# Patient Record
Sex: Male | Born: 1955 | Race: White | Hispanic: No | Marital: Married | Smoking: Never smoker
Health system: Southern US, Community
[De-identification: ages and names within clinical notes are randomized; demographics above are authoritative.]

## PROBLEM LIST (undated history)

## (undated) DIAGNOSIS — T7840XA Allergy, unspecified, initial encounter: Secondary | ICD-10-CM

## (undated) DIAGNOSIS — R42 Dizziness and giddiness: Secondary | ICD-10-CM

## (undated) DIAGNOSIS — I10 Essential (primary) hypertension: Secondary | ICD-10-CM

## (undated) DIAGNOSIS — J329 Chronic sinusitis, unspecified: Secondary | ICD-10-CM

## (undated) DIAGNOSIS — F419 Anxiety disorder, unspecified: Secondary | ICD-10-CM

## (undated) HISTORY — DX: Anxiety disorder, unspecified: F41.9

## (undated) HISTORY — DX: Allergy, unspecified, initial encounter: T78.40XA

## (undated) HISTORY — DX: Essential (primary) hypertension: I10

---

## 1985-11-25 HISTORY — PX: URETEROURETEROSTOMY: SHX497

## 2003-03-27 ENCOUNTER — Emergency Department (HOSPITAL_COMMUNITY): Admission: EM | Admit: 2003-03-27 | Discharge: 2003-03-28 | Payer: Self-pay | Admitting: Emergency Medicine

## 2006-03-24 ENCOUNTER — Emergency Department (HOSPITAL_COMMUNITY): Admission: EM | Admit: 2006-03-24 | Discharge: 2006-03-24 | Payer: Self-pay | Admitting: Emergency Medicine

## 2010-06-14 ENCOUNTER — Emergency Department (HOSPITAL_COMMUNITY): Admission: EM | Admit: 2010-06-14 | Discharge: 2010-06-14 | Payer: Self-pay | Admitting: Emergency Medicine

## 2010-07-15 ENCOUNTER — Emergency Department (HOSPITAL_COMMUNITY): Admission: EM | Admit: 2010-07-15 | Discharge: 2010-07-16 | Payer: Self-pay | Admitting: Emergency Medicine

## 2011-02-09 LAB — POCT I-STAT, CHEM 8
BUN: 11 mg/dL (ref 6–23)
Calcium, Ion: 1.15 mmol/L (ref 1.12–1.32)
Chloride: 104 mEq/L (ref 96–112)
Glucose, Bld: 136 mg/dL — ABNORMAL HIGH (ref 70–99)
HCT: 50 % (ref 39.0–52.0)
Potassium: 3.5 mEq/L (ref 3.5–5.1)
Sodium: 140 mEq/L (ref 135–145)

## 2012-06-14 ENCOUNTER — Emergency Department (HOSPITAL_COMMUNITY)
Admission: EM | Admit: 2012-06-14 | Discharge: 2012-06-14 | Disposition: A | Discharge status: Home / Self Care | Payer: BC Managed Care – PPO | Attending: Emergency Medicine | Admitting: Emergency Medicine

## 2012-06-14 ENCOUNTER — Encounter (HOSPITAL_COMMUNITY): Payer: Self-pay | Admitting: *Deleted

## 2012-06-14 DIAGNOSIS — R42 Dizziness and giddiness: Secondary | ICD-10-CM | POA: Insufficient documentation

## 2012-06-14 DIAGNOSIS — H8110 Benign paroxysmal vertigo, unspecified ear: Secondary | ICD-10-CM

## 2012-06-14 HISTORY — DX: Chronic sinusitis, unspecified: J32.9

## 2012-06-14 HISTORY — DX: Dizziness and giddiness: R42

## 2012-06-14 LAB — BASIC METABOLIC PANEL
Calcium: 8.3 mg/dL — ABNORMAL LOW (ref 8.4–10.5)
Creatinine, Ser: 0.98 mg/dL (ref 0.50–1.35)
GFR calc non Af Amer: >90 mL/min (ref >90)
Potassium: 3.3 mEq/L — ABNORMAL LOW (ref 3.5–5.1)

## 2012-06-14 LAB — CBC
HCT: 42.5 % (ref 39.0–52.0)
Hemoglobin: 14.7 g/dL (ref 13.0–17.0)
MCH: 30.8 pg (ref 26.0–34.0)
MCHC: 34.6 g/dL (ref 30.0–36.0)

## 2012-06-14 MED ORDER — ONDANSETRON HCL 4 MG PO TABS: 4.0000 mg | ORAL_TABLET | Freq: Four times a day (QID) | ORAL | Status: AC

## 2012-06-14 MED ORDER — POTASSIUM CHLORIDE CRYS ER 20 MEQ PO TBCR
40.0000 meq | EXTENDED_RELEASE_TABLET | Freq: Once | ORAL | Status: AC
  Administered 2012-06-14: 40 meq via ORAL
  Filled 2012-06-14: qty 2

## 2012-06-14 MED ORDER — MECLIZINE HCL 25 MG PO TABS
50.0000 mg | ORAL_TABLET | Freq: Once | ORAL | Status: AC
  Administered 2012-06-14: 50 mg via ORAL
  Filled 2012-06-14: qty 2

## 2012-06-14 MED ORDER — MECLIZINE HCL 50 MG PO TABS: 50.0000 mg | ORAL_TABLET | Freq: Three times a day (TID) | ORAL | Status: AC | PRN

## 2012-06-14 MED ORDER — AZITHROMYCIN 250 MG PO TABS: 250.0000 mg | ORAL_TABLET | Freq: Every day | ORAL | Status: AC

## 2012-06-14 NOTE — ED Notes (Signed)
Per EMS: patient awoke tonight with c/o severe dizziness and emesis. Patient sts he has been out in the heat and working out and not taking in fluids as he normally does. Patient nauseous on arrival. 4 zofran given in route, 18gauge Iv to left forearm, started by EMS. Patient has had sinus headache x 2 days.

## 2012-06-14 NOTE — ED Notes (Signed)
Pt denies having any pain, but states that he has extreme dizziness when he opens his eyes.

## 2012-06-14 NOTE — ED Notes (Signed)
ZOX:[id number]<AV> Expected date:06/14/12<BR> Expected time: 4:35 AM<BR> Means of arrival:Ambulance<BR> Comments:<BR> RM 12. Dizziness, vomiting, hx of vertigo

## 2012-06-14 NOTE — ED Notes (Signed)
Pt refused to perform orthostatic vital signs due to severe dizziness and nausea. Mardella Layman RN notified of patient's refusal. Pt states that he does not want to sit up or stand.

## 2012-06-14 NOTE — ED Provider Notes (Signed)
History     CSN: [id number]  Arrival date & time 06/14/12  0458   First MD Initiated Contact with Patient 06/14/12 0715      Chief Complaint  Patient presents with  . Dizziness    (Consider location/radiation/quality/duration/timing/severity/associated sxs/prior treatment) HPI Comments: 56 y/o otherwise healthy male comes in with cc of vertigo. Pt reports that he got woken up overnight due to vertiginous sensation. He described his vertigo as severe spinning sensation, that is worse with certain positions. There is associated nausea and emesis x 1. Patient reportst having similar symptoms about a year ago that was deemed to be due to sinusitis, and he was treated with antibiotics at that time. The current symptoms are worse. Patient does have some rhinorrhea, congestion and post nasal drip. He denies any ear pain ,but does have bilateral tinnitus, right worse than left. Pt has no fevers, chill, no headaches at this time.  The history is provided by the patient.    Past Medical History  Diagnosis Date  . Vertigo   . Sinus infection     History reviewed. No pertinent past surgical history.  History reviewed. No pertinent family history.  History  Substance Use Topics  . Smoking status: Not on file  . Smokeless tobacco: Not on file  . Alcohol Use:       Review of Systems  Constitutional: Positive for activity change. Negative for fever and chills.  HENT: Positive for congestion, rhinorrhea, postnasal drip and tinnitus. Negative for hearing loss, ear pain, neck pain and ear discharge.   Eyes: Negative for visual disturbance.  Respiratory: Negative for cough, chest tightness and shortness of breath.   Cardiovascular: Negative for chest pain.  Gastrointestinal: Negative for abdominal distention.  Genitourinary: Negative for dysuria, enuresis and difficulty urinating.  Musculoskeletal: Negative for arthralgias.  Neurological: Positive for dizziness. Negative for tremors,  seizures, syncope, speech difficulty, light-headedness, numbness and headaches.  Psychiatric/Behavioral: Negative for confusion.    Allergies  Codeine  Home Medications   Current Outpatient Rx  Name Route Sig Dispense Refill  . ALPRAZOLAM 0.5 MG PO TABS Oral Take 0.5 mg by mouth as needed. For anxiety    . DIPHENHYDRAMINE HCL (SLEEP) 25 MG PO TABS Oral Take 25 mg by mouth at bedtime as needed.    . TERBINAFINE HCL 250 MG PO TABS Oral Take 250 mg by mouth daily.      BP 102/64  Pulse 76  Temp 97.4 F (36.3 C) (Oral)  Resp 16  Ht 5\' 7"  (1.702 m)  Wt 160 lb (72.576 kg)  BMI 25.06 kg/m2  SpO2 97%  Physical Exam  Constitutional: He is oriented to person, place, and time. He appears well-developed.  HENT:  Head: Normocephalic and atraumatic.       nilateral cerumen in the canal - but no impaction, and TM is visualized.  Eyes: Conjunctivae and EOM are normal. Pupils are equal, round, and reactive to light.       There is some nystagmus appreciated with horizontal gaze, and a little bit of saccadic eye movement, particularly with left sided gaze.   Neck: Normal range of motion. Neck supple.  Cardiovascular: Normal rate, regular rhythm and normal heart sounds.   Pulmonary/Chest: Effort normal and breath sounds normal. No respiratory distress. He has no wheezes.  Abdominal: Soft. Bowel sounds are normal. He exhibits no distension. There is no tenderness. There is no rebound and no guarding.  Neurological: He is alert and oriented to person, place, and time.  No cranial nerve deficit. Coordination normal.       DIX HALLPIKE NOT DONE  - PATIENT'S SYMPTOMS WERE REPRODUCED WITH HIM JUST SITTING UP, AND IMPROVED WITH HIM LAYING THERE FOR FEW SECONDS.   Skin: Skin is warm.  Psychiatric: He has a normal mood and affect. His behavior is normal. Judgment and thought content normal.    ED Course  Procedures (including critical care time)  Labs Reviewed  BASIC METABOLIC PANEL - Abnormal;  Notable for the following:    Potassium 3.3 (*)     Glucose, Bld 146 (*)     Calcium 8.3 (*)     All other components within normal limits  CBC   No results found.   No diagnosis found.    MDM  Patient comes in with cc of vertigo. Based on history and findings, this appears to be peripheral vertigo. Although previously he had sinusitis as the cause, this appears more so to be BPPV - as his sinus exam wasn't too remarkable. Will give meclizine and reassess.       9:42 AM Pt is feeling a lot better. Able to open his eye and sit up. Still has some vertigo like sensation with movement, but tolerable. We will discharge at this time with meclizine. We will give antibiotics as wait and watch approach. Pt not on HCTZ or other precipitants.  9:47 AM Epley maneuver material provided.  Derwood Kaplan, MD 06/16/12 1846

## 2012-06-14 NOTE — ED Notes (Signed)
Pt refused orthostatic vital signs. 

## 2013-03-01 ENCOUNTER — Ambulatory Visit
Admission: RE | Admit: 2013-03-01 | Discharge: 2013-03-01 | Disposition: A | Discharge status: Home / Self Care | Payer: BC Managed Care – PPO | Source: Ambulatory Visit | Attending: Family Medicine | Admitting: Family Medicine

## 2013-03-01 ENCOUNTER — Other Ambulatory Visit: Payer: Self-pay | Admitting: Family Medicine

## 2013-03-01 DIAGNOSIS — R140 Abdominal distension (gaseous): Secondary | ICD-10-CM

## 2013-03-01 MED ORDER — IOHEXOL 300 MG/ML  SOLN
100.0000 mL | Freq: Once | INTRAMUSCULAR | Status: AC | PRN
  Administered 2013-03-01: 100 mL via INTRAVENOUS

## 2016-07-10 DIAGNOSIS — R7301 Impaired fasting glucose: Secondary | ICD-10-CM | POA: Diagnosis not present

## 2016-07-10 DIAGNOSIS — Z0000 Encounter for general adult medical examination without abnormal findings: Secondary | ICD-10-CM | POA: Diagnosis not present

## 2016-07-10 DIAGNOSIS — Z125 Encounter for screening for malignant neoplasm of prostate: Secondary | ICD-10-CM | POA: Diagnosis not present

## 2016-07-16 DIAGNOSIS — Z0000 Encounter for general adult medical examination without abnormal findings: Secondary | ICD-10-CM | POA: Diagnosis not present

## 2016-07-16 DIAGNOSIS — R030 Elevated blood-pressure reading, without diagnosis of hypertension: Secondary | ICD-10-CM | POA: Diagnosis not present

## 2016-07-16 DIAGNOSIS — Z1389 Encounter for screening for other disorder: Secondary | ICD-10-CM | POA: Diagnosis not present

## 2016-07-16 DIAGNOSIS — R7301 Impaired fasting glucose: Secondary | ICD-10-CM | POA: Diagnosis not present

## 2016-07-16 DIAGNOSIS — Z23 Encounter for immunization: Secondary | ICD-10-CM | POA: Diagnosis not present

## 2016-07-16 DIAGNOSIS — M40209 Unspecified kyphosis, site unspecified: Secondary | ICD-10-CM | POA: Diagnosis not present

## 2016-07-25 DIAGNOSIS — D2272 Melanocytic nevi of left lower limb, including hip: Secondary | ICD-10-CM | POA: Diagnosis not present

## 2016-07-25 DIAGNOSIS — D225 Melanocytic nevi of trunk: Secondary | ICD-10-CM | POA: Diagnosis not present

## 2016-07-25 DIAGNOSIS — D1801 Hemangioma of skin and subcutaneous tissue: Secondary | ICD-10-CM | POA: Diagnosis not present

## 2016-07-25 DIAGNOSIS — D224 Melanocytic nevi of scalp and neck: Secondary | ICD-10-CM | POA: Diagnosis not present

## 2016-11-14 DIAGNOSIS — H01004 Unspecified blepharitis left upper eyelid: Secondary | ICD-10-CM | POA: Diagnosis not present

## 2016-11-14 DIAGNOSIS — H5211 Myopia, right eye: Secondary | ICD-10-CM | POA: Diagnosis not present

## 2016-11-14 DIAGNOSIS — H01001 Unspecified blepharitis right upper eyelid: Secondary | ICD-10-CM | POA: Diagnosis not present

## 2017-06-24 DIAGNOSIS — Z6824 Body mass index (BMI) 24.0-24.9, adult: Secondary | ICD-10-CM | POA: Diagnosis not present

## 2017-06-24 DIAGNOSIS — J020 Streptococcal pharyngitis: Secondary | ICD-10-CM | POA: Diagnosis not present

## 2017-06-24 DIAGNOSIS — J028 Acute pharyngitis due to other specified organisms: Secondary | ICD-10-CM | POA: Diagnosis not present

## 2017-07-10 DIAGNOSIS — R030 Elevated blood-pressure reading, without diagnosis of hypertension: Secondary | ICD-10-CM | POA: Diagnosis not present

## 2017-07-10 DIAGNOSIS — Z0000 Encounter for general adult medical examination without abnormal findings: Secondary | ICD-10-CM | POA: Diagnosis not present

## 2017-07-10 DIAGNOSIS — Z125 Encounter for screening for malignant neoplasm of prostate: Secondary | ICD-10-CM | POA: Diagnosis not present

## 2017-07-10 DIAGNOSIS — R7301 Impaired fasting glucose: Secondary | ICD-10-CM | POA: Diagnosis not present

## 2017-07-17 DIAGNOSIS — B351 Tinea unguium: Secondary | ICD-10-CM | POA: Diagnosis not present

## 2017-07-17 DIAGNOSIS — Z0000 Encounter for general adult medical examination without abnormal findings: Secondary | ICD-10-CM | POA: Diagnosis not present

## 2017-07-17 DIAGNOSIS — Z1389 Encounter for screening for other disorder: Secondary | ICD-10-CM | POA: Diagnosis not present

## 2017-07-17 DIAGNOSIS — R7301 Impaired fasting glucose: Secondary | ICD-10-CM | POA: Diagnosis not present

## 2017-07-17 DIAGNOSIS — E784 Other hyperlipidemia: Secondary | ICD-10-CM | POA: Diagnosis not present

## 2017-07-17 DIAGNOSIS — R030 Elevated blood-pressure reading, without diagnosis of hypertension: Secondary | ICD-10-CM | POA: Diagnosis not present

## 2017-07-18 DIAGNOSIS — Z1212 Encounter for screening for malignant neoplasm of rectum: Secondary | ICD-10-CM | POA: Diagnosis not present

## 2017-07-31 DIAGNOSIS — D225 Melanocytic nevi of trunk: Secondary | ICD-10-CM | POA: Diagnosis not present

## 2017-07-31 DIAGNOSIS — D2271 Melanocytic nevi of right lower limb, including hip: Secondary | ICD-10-CM | POA: Diagnosis not present

## 2017-07-31 DIAGNOSIS — I788 Other diseases of capillaries: Secondary | ICD-10-CM | POA: Diagnosis not present

## 2017-07-31 DIAGNOSIS — D2261 Melanocytic nevi of right upper limb, including shoulder: Secondary | ICD-10-CM | POA: Diagnosis not present

## 2017-07-31 DIAGNOSIS — D485 Neoplasm of uncertain behavior of skin: Secondary | ICD-10-CM | POA: Diagnosis not present

## 2017-07-31 DIAGNOSIS — L720 Epidermal cyst: Secondary | ICD-10-CM | POA: Diagnosis not present

## 2017-08-09 DIAGNOSIS — Z23 Encounter for immunization: Secondary | ICD-10-CM | POA: Diagnosis not present

## 2017-11-14 DIAGNOSIS — H01001 Unspecified blepharitis right upper eyelid: Secondary | ICD-10-CM | POA: Diagnosis not present

## 2017-11-14 DIAGNOSIS — H5211 Myopia, right eye: Secondary | ICD-10-CM | POA: Diagnosis not present

## 2017-11-14 DIAGNOSIS — H5213 Myopia, bilateral: Secondary | ICD-10-CM | POA: Diagnosis not present

## 2018-01-22 ENCOUNTER — Other Ambulatory Visit: Payer: Self-pay | Admitting: Internal Medicine

## 2018-01-22 DIAGNOSIS — E785 Hyperlipidemia, unspecified: Secondary | ICD-10-CM

## 2018-02-02 ENCOUNTER — Ambulatory Visit
Admission: RE | Admit: 2018-02-02 | Discharge: 2018-02-02 | Disposition: A | Discharge status: Home / Self Care | Payer: No Typology Code available for payment source | Source: Ambulatory Visit | Attending: Internal Medicine | Admitting: Internal Medicine

## 2018-02-02 DIAGNOSIS — E785 Hyperlipidemia, unspecified: Secondary | ICD-10-CM

## 2018-03-31 DIAGNOSIS — R030 Elevated blood-pressure reading, without diagnosis of hypertension: Secondary | ICD-10-CM | POA: Diagnosis not present

## 2018-03-31 DIAGNOSIS — K649 Unspecified hemorrhoids: Secondary | ICD-10-CM | POA: Diagnosis not present

## 2018-03-31 DIAGNOSIS — B359 Dermatophytosis, unspecified: Secondary | ICD-10-CM | POA: Diagnosis not present

## 2018-04-09 DIAGNOSIS — K644 Residual hemorrhoidal skin tags: Secondary | ICD-10-CM | POA: Diagnosis not present

## 2018-06-04 DIAGNOSIS — R030 Elevated blood-pressure reading, without diagnosis of hypertension: Secondary | ICD-10-CM | POA: Diagnosis not present

## 2018-06-04 DIAGNOSIS — Z6824 Body mass index (BMI) 24.0-24.9, adult: Secondary | ICD-10-CM | POA: Diagnosis not present

## 2018-06-04 DIAGNOSIS — B359 Dermatophytosis, unspecified: Secondary | ICD-10-CM | POA: Diagnosis not present

## 2018-06-04 DIAGNOSIS — K649 Unspecified hemorrhoids: Secondary | ICD-10-CM | POA: Diagnosis not present

## 2018-07-17 DIAGNOSIS — R300 Dysuria: Secondary | ICD-10-CM | POA: Diagnosis not present

## 2018-07-17 DIAGNOSIS — Z125 Encounter for screening for malignant neoplasm of prostate: Secondary | ICD-10-CM | POA: Diagnosis not present

## 2018-07-17 DIAGNOSIS — R7301 Impaired fasting glucose: Secondary | ICD-10-CM | POA: Diagnosis not present

## 2018-07-17 DIAGNOSIS — Z0000 Encounter for general adult medical examination without abnormal findings: Secondary | ICD-10-CM | POA: Diagnosis not present

## 2018-07-24 DIAGNOSIS — K649 Unspecified hemorrhoids: Secondary | ICD-10-CM | POA: Diagnosis not present

## 2018-07-24 DIAGNOSIS — L408 Other psoriasis: Secondary | ICD-10-CM | POA: Diagnosis not present

## 2018-07-24 DIAGNOSIS — Z23 Encounter for immunization: Secondary | ICD-10-CM | POA: Diagnosis not present

## 2018-07-24 DIAGNOSIS — Z0000 Encounter for general adult medical examination without abnormal findings: Secondary | ICD-10-CM | POA: Diagnosis not present

## 2018-07-24 DIAGNOSIS — Z1389 Encounter for screening for other disorder: Secondary | ICD-10-CM | POA: Diagnosis not present

## 2018-07-24 DIAGNOSIS — R7301 Impaired fasting glucose: Secondary | ICD-10-CM | POA: Diagnosis not present

## 2018-08-04 DIAGNOSIS — Z1212 Encounter for screening for malignant neoplasm of rectum: Secondary | ICD-10-CM | POA: Diagnosis not present

## 2018-08-06 DIAGNOSIS — D2271 Melanocytic nevi of right lower limb, including hip: Secondary | ICD-10-CM | POA: Diagnosis not present

## 2018-08-06 DIAGNOSIS — L918 Other hypertrophic disorders of the skin: Secondary | ICD-10-CM | POA: Diagnosis not present

## 2018-08-06 DIAGNOSIS — D1801 Hemangioma of skin and subcutaneous tissue: Secondary | ICD-10-CM | POA: Diagnosis not present

## 2018-08-06 DIAGNOSIS — L858 Other specified epidermal thickening: Secondary | ICD-10-CM | POA: Diagnosis not present

## 2018-08-06 DIAGNOSIS — D2261 Melanocytic nevi of right upper limb, including shoulder: Secondary | ICD-10-CM | POA: Diagnosis not present

## 2018-09-22 DIAGNOSIS — J069 Acute upper respiratory infection, unspecified: Secondary | ICD-10-CM | POA: Diagnosis not present

## 2018-09-22 DIAGNOSIS — J029 Acute pharyngitis, unspecified: Secondary | ICD-10-CM | POA: Diagnosis not present

## 2018-09-22 DIAGNOSIS — Z6824 Body mass index (BMI) 24.0-24.9, adult: Secondary | ICD-10-CM | POA: Diagnosis not present

## 2018-09-22 DIAGNOSIS — J3089 Other allergic rhinitis: Secondary | ICD-10-CM | POA: Diagnosis not present

## 2019-04-02 ENCOUNTER — Encounter: Payer: Self-pay | Admitting: Podiatry

## 2019-04-02 ENCOUNTER — Other Ambulatory Visit: Payer: Self-pay

## 2019-04-02 ENCOUNTER — Ambulatory Visit: Payer: BLUE CROSS/BLUE SHIELD | Admitting: Podiatry

## 2019-04-02 VITALS — Temp 97.3°F

## 2019-04-02 DIAGNOSIS — Q828 Other specified congenital malformations of skin: Secondary | ICD-10-CM

## 2019-04-02 DIAGNOSIS — M79671 Pain in right foot: Secondary | ICD-10-CM

## 2019-04-02 DIAGNOSIS — B351 Tinea unguium: Secondary | ICD-10-CM | POA: Diagnosis not present

## 2019-04-02 NOTE — Patient Instructions (Signed)
Keep the bandage on for 24 hours. At that time, remove and clean with soap and water. If it hurts or burns before 24 hours go ahead and remove the bandage and wash with soap and water. Keep the area clean. If there is any blistering cover with antibiotic ointment and a bandage. Monitor for any redness, drainage, or other signs of infection. Call the office if any are to occur. If you have any questions, please call the office at [phone].  

## 2019-04-02 NOTE — Progress Notes (Signed)
Subjective:   Patient ID: Jose Pratt, male   DOB: 63 y.o.   MRN: [id number]   HPI 63 year old male presents the office today for concerns of a painful skin lesion to the bottom of his right third toe area.  He states he was having some plantar fasciitis issues and he purchased inserts he thinks that they were sliding and since he wore the inserts this causes the lesion to form.  Not sure if there is a blister or what is going on.  No recent treatment.  He states that since switching shoes he has been improved.  Denies any injury or trauma denies of any foreign objects.  No swelling or redness.  He also states is a secondary issue he has been having some toenail fungus issues to both of his big toes he has been on 2 rounds of Lamisil without any significant improvement.  No pain of nails no redness or drainage or any signs of infection.  No other issues today.    Review of Systems  All other systems reviewed and are negative.  Past Medical History:  Diagnosis Date  . Sinus infection   . Vertigo     History reviewed. No pertinent surgical history.  No current outpatient medications on file.  Allergies  Allergen Reactions  . Codeine          Objective:  Physical Exam  General: AAO x3, NAD  Dermatological: Hyperkeratotic lesion just distal to the metatarsal head on the right third.  Upon debridement there is a punctate annular hyperkeratotic lesion that there is no underlying evidence of verruca, foreign body or any signs of an ulcer.  There is no drainage or pus.  Bilateral hallux nails are hypertrophic, dystrophic with yellow-brown discoloration.  There is no pain of the nails there is no surrounding redness or drainage clinical signs of infection noted today.  Vascular: Dorsalis Pedis artery and Posterior Tibial artery pedal pulses are 2/4 bilateral with immedate capillary fill time.There is no pain with calf compression, swelling, warmth, erythema.   Neruologic: Grossly  intact via light touch bilateral.  Protective threshold with Semmes Wienstein monofilament intact to all pedal sites bilateral.   Musculoskeletal: No gross boney pedal deformities bilateral. No pain, crepitus, or limitation noted with foot and ankle range of motion bilateral. Muscular strength 5/5 in all groups tested bilateral.  Gait: Unassisted, Nonantalgic.      Assessment:   63 year old male porokeratosis right foot, onychomycosis     Plan:  -Treatment options discussed including all alternatives, risks, and complications -Etiology of symptoms were discussed -Lesion was sharply debrided without any complications or bleeding.  Area was cleaned with alcohol and a pad was placed followed by salicylic acid and a bandage.  Post procedure instructions were discussed.  Monitoring signs or symptoms of infection. -Debrided bilateral hallux toenail specimens for culture, pathology to [facility] labs.   Return in about 6 weeks (around 05/14/2019).  Vivi Barrack DPM

## 2019-04-12 ENCOUNTER — Other Ambulatory Visit: Payer: Self-pay | Admitting: Podiatry

## 2019-04-12 ENCOUNTER — Encounter: Payer: Self-pay | Admitting: Podiatry

## 2019-04-12 ENCOUNTER — Ambulatory Visit: Payer: BLUE CROSS/BLUE SHIELD | Admitting: Podiatry

## 2019-04-12 ENCOUNTER — Ambulatory Visit (INDEPENDENT_AMBULATORY_CARE_PROVIDER_SITE_OTHER): Payer: BLUE CROSS/BLUE SHIELD

## 2019-04-12 ENCOUNTER — Other Ambulatory Visit: Payer: Self-pay

## 2019-04-12 VITALS — Temp 97.9°F

## 2019-04-12 DIAGNOSIS — S90851A Superficial foreign body, right foot, initial encounter: Secondary | ICD-10-CM

## 2019-04-12 DIAGNOSIS — M779 Enthesopathy, unspecified: Secondary | ICD-10-CM

## 2019-04-12 DIAGNOSIS — Q828 Other specified congenital malformations of skin: Secondary | ICD-10-CM

## 2019-04-15 NOTE — Progress Notes (Signed)
Subjective: 63 year old male presents the office today for follow-up evaluation of a painful skin lesion on the bottom of his right foot just proximal to the third digit.  He wants to come in to be seen because the pain is started to reoccur.  After he left the appointment last visit he states he felt great for some time but the pain did recur and because of this he want to come in for an earlier appointment.  He had no complications after the medication applied last visit.  He denies any redness or drainage or any swelling. Denies any systemic complaints such as fevers, chills, nausea, vomiting. No acute changes since last appointment, and no other complaints at this time.   Objective: AAO x3, NAD DP/PT pulses palpable bilaterally, CRT less than 3 seconds Hyperkeratotic lesion present on the right foot just proximal to the third digit.  Upon debridement this area has some macerated tissue as well as single punctate annular lesion in the middle.  There is no ongoing ulceration drainage or any signs of infection.  There was a small area of pinpoint bleeding that appeared to be more verruca today. No open lesions or pre-ulcerative lesions.  No pain with calf compression, swelling, warmth, erythema  Assessment: Right foot skin lesion, rule out foreign body  Plan: -All treatment options discussed with the patient including all alternatives, risks, complications.  -X-rays were obtained reviewed to rule out foreign body today given the appearance.  No evidence of foreign body identified. -Sharp debrided the hyperkeratotic tissue without any complications or bleeding.  There is a small amount of pinpoint bleeding which consistent with verruca.  However given that she was applied medicine last week and the area appeared to be well I did not apply any further acids today.  Offloading pads were dispensed.  I want this area to heal and we will see him back at the next appointment in the we will discuss further  treatment options. -Bako results have not come back yet.   Vivi Barrack DPM  -Patient encouraged to call the office with any questions, concerns, change in symptoms.

## 2019-04-26 ENCOUNTER — Other Ambulatory Visit: Payer: Self-pay

## 2019-04-26 ENCOUNTER — Ambulatory Visit: Payer: BLUE CROSS/BLUE SHIELD | Admitting: Podiatry

## 2019-04-26 ENCOUNTER — Encounter: Payer: Self-pay | Admitting: Podiatry

## 2019-04-26 VITALS — Temp 98.1°F

## 2019-04-26 DIAGNOSIS — B078 Other viral warts: Secondary | ICD-10-CM | POA: Diagnosis not present

## 2019-04-26 DIAGNOSIS — B079 Viral wart, unspecified: Secondary | ICD-10-CM

## 2019-04-26 NOTE — Progress Notes (Signed)
Subjective: 63 year old male presents the office today for evaluation of a spot on the ball of the right foot.  He states that it be getting somewhat better but still present.  Denies any increase in swelling redness or any drainage or pus. Denies any systemic complaints such as fevers, chills, nausea, vomiting. No acute changes since last appointment, and no other complaints at this time.   Objective: AAO x3, NAD DP/PT pulses palpable bilaterally, CRT less than 3 seconds Hyperkeratotic lesion still present on the right foot just proximal to the third digit plantarly.  Upon debridement there is hyperkeratotic lesion and after debridement it did appear to be more verruca today.  There is no surrounding erythema, ascending cellulitis.  No fluctuation crepitation any malodor. No open lesions or pre-ulcerative lesions.  No pain with calf compression, swelling, warmth, erythema  Assessment: Skin lesion right foot, appears to be more of a verruca today  Plan: -All treatment options discussed with the patient including all alternatives, risks, complications.  -Patient debrided the lesion without any complications or bleeding.  The area still with alcohol and a pad was placed followed by salicylic acid and a bandage.  He tolerated well.  Monitoring signs or symptoms of infection. -Patient encouraged to call the office with any questions, concerns, change in symptoms.   Vivi Barrack DPM

## 2019-05-05 ENCOUNTER — Telehealth: Payer: Self-pay | Admitting: *Deleted

## 2019-05-05 NOTE — Telephone Encounter (Signed)
-----   Message from Trula Slade, DPM sent at 05/03/2019  7:58 AM EDT ----- At some point this week can you call to see how he is doing? Thanks.

## 2019-05-05 NOTE — Telephone Encounter (Signed)
Called and spoke with the patient and the area of the right foot is doing so much better and can tell a difference and the doctor did a great job and I am almost back to normal and I would say 90% better and I am going to keep my appointment and I stated to call the office if any concerns or questions at [phone]. Lattie Haw

## 2019-05-14 ENCOUNTER — Ambulatory Visit: Payer: BLUE CROSS/BLUE SHIELD | Admitting: Podiatry

## 2019-05-17 ENCOUNTER — Ambulatory Visit (INDEPENDENT_AMBULATORY_CARE_PROVIDER_SITE_OTHER): Payer: BC Managed Care – PPO | Admitting: Podiatry

## 2019-05-17 ENCOUNTER — Other Ambulatory Visit: Payer: Self-pay

## 2019-05-17 ENCOUNTER — Encounter: Payer: Self-pay | Admitting: Podiatry

## 2019-05-17 VITALS — Temp 98.4°F

## 2019-05-17 DIAGNOSIS — B079 Viral wart, unspecified: Secondary | ICD-10-CM

## 2019-05-17 DIAGNOSIS — B351 Tinea unguium: Secondary | ICD-10-CM | POA: Diagnosis not present

## 2019-05-17 DIAGNOSIS — B078 Other viral warts: Secondary | ICD-10-CM | POA: Diagnosis not present

## 2019-05-18 ENCOUNTER — Telehealth: Payer: Self-pay | Admitting: *Deleted

## 2019-05-18 MED ORDER — JUBLIA 10 % EX SOLN: CUTANEOUS | 11 refills | Status: DC

## 2019-05-18 MED ORDER — JUBLIA 10 % EX SOLN: 1.0000 | Freq: Every day | CUTANEOUS | 11 refills | Status: DC

## 2019-05-18 NOTE — Telephone Encounter (Signed)
-----   Message from Trula Slade, DPM sent at 05/18/2019  1:15 PM EDT ----- I was trying to order jublia for him but when I look for the "[facility]" I cannot find it. Can you help me...please!!!

## 2019-05-18 NOTE — Telephone Encounter (Signed)
rx for jublia sent to [facility].

## 2019-05-19 NOTE — Progress Notes (Signed)
Subjective: 63 year old male presents the office today for evaluation of a skin lesion on the ball of the right foot.  He states he was doing well and is having no issues however the lesion started to come back causing pain. Denies any systemic complaints such as fevers, chills, nausea, vomiting. No acute changes since last appointment, and no other complaints at this time.   Objective: AAO x3, NAD DP/PT pulses palpable bilaterally, CRT less than 3 seconds Hyperkeratotic lesion still present on the right foot just proximal to the third digit plantarly.  Upon debridement there was evidence of verruca present.  There is no surrounding erythema, edema, drainage or pus or any clinical signs of infection.   Hallux nails are mildly discolored yellow discoloration and mildly hypertrophic.  No pain. No open lesions or pre-ulcerative lesions.  No pain with calf compression, swelling, warmth, erythema  Assessment: Skin lesion right foot, verruca; onychomycosis  Plan: -All treatment options discussed with the patient including all alternatives, risks, complications.  -Patient debrided the lesion without any complications or bleeding.  The area still with alcohol and a pad was placed followed by salicylic acid and a bandage. He tolerated well.  Monitoring signs or symptoms of infection. -Order compound cream today in regards to the verruca. -Reviewed nail culture with him today.  Prescribe Jublia for nail fungus -Patient encouraged to call the office with any questions, concerns, change in symptoms.   Trula Slade DPM

## 2019-05-24 ENCOUNTER — Telehealth: Payer: Self-pay | Admitting: *Deleted

## 2019-05-24 NOTE — Telephone Encounter (Signed)
-----   Message from Trula Slade, DPM sent at 05/19/2019 11:58 AM EDT ----- I don't thin I ordered the wart cream from [facility]. Can you send it over or remind me to do it Thursday? Thanks.

## 2019-05-24 NOTE — Telephone Encounter (Signed)
I sent the wart cream over to the [facility] on June 24 th 2020. Jose Pratt

## 2019-06-10 ENCOUNTER — Ambulatory Visit: Payer: BC Managed Care – PPO | Admitting: Podiatry

## 2019-06-10 ENCOUNTER — Other Ambulatory Visit: Payer: Self-pay

## 2019-06-10 DIAGNOSIS — B351 Tinea unguium: Secondary | ICD-10-CM

## 2019-06-10 DIAGNOSIS — B078 Other viral warts: Secondary | ICD-10-CM

## 2019-06-10 DIAGNOSIS — B079 Viral wart, unspecified: Secondary | ICD-10-CM

## 2019-06-10 NOTE — Progress Notes (Signed)
Subjective: 63 year old male presents the office today for evaluation of a skin lesion, verruca on the ball of the right foot.  Overall he states his been doing better.  No issues after last treatment of the salicylic acid.  He has been using a compound cream for the wart as well which is been helpful.  He states his been able to walk and more active without any pain.  No redness or drainage.  He also did get the Jublia.  No acute changes since last appointment, and no other complaints at this time.   Objective: AAO x3, NAD DP/PT pulses palpable bilaterally, CRT less than 3 seconds Hyperkeratotic lesion still present on the right foot just proximal to the third digit plantarly.  Overall the area is much smaller.  Upon debridement is more superficial.  No surrounding edema, erythema, drainage or pus there is no clinical signs of infection noted today.  No open lesions or pre-ulcerative lesions.  No pain with calf compression, swelling, warmth, erythema  Assessment: Skin lesion right foot, verruca; onychomycosis  Plan: -All treatment options discussed with the patient including all alternatives, risks, complications.  -Patient debrided the lesion without any complications or bleeding.  The area still with alcohol and a pad was placed followed by salicylic acid and a bandage. He tolerated well.  Monitoring signs or symptoms of infection. -Continue compound cream for verruca.  Only to restart this new about 3 to 4 days. -Continue Jublia for nail fungus -Patient encouraged to call the office with any questions, concerns, change in symptoms.   Trula Slade DPM

## 2019-07-12 ENCOUNTER — Ambulatory Visit: Payer: BC Managed Care – PPO | Admitting: Podiatry

## 2019-08-03 DIAGNOSIS — Z125 Encounter for screening for malignant neoplasm of prostate: Secondary | ICD-10-CM | POA: Diagnosis not present

## 2019-08-03 DIAGNOSIS — Z0000 Encounter for general adult medical examination without abnormal findings: Secondary | ICD-10-CM | POA: Diagnosis not present

## 2019-08-03 DIAGNOSIS — R7301 Impaired fasting glucose: Secondary | ICD-10-CM | POA: Diagnosis not present

## 2019-08-03 DIAGNOSIS — E7849 Other hyperlipidemia: Secondary | ICD-10-CM | POA: Diagnosis not present

## 2019-08-04 DIAGNOSIS — R82998 Other abnormal findings in urine: Secondary | ICD-10-CM | POA: Diagnosis not present

## 2019-08-09 DIAGNOSIS — E785 Hyperlipidemia, unspecified: Secondary | ICD-10-CM | POA: Diagnosis not present

## 2019-08-09 DIAGNOSIS — Z0000 Encounter for general adult medical examination without abnormal findings: Secondary | ICD-10-CM | POA: Diagnosis not present

## 2019-08-09 DIAGNOSIS — R030 Elevated blood-pressure reading, without diagnosis of hypertension: Secondary | ICD-10-CM | POA: Diagnosis not present

## 2019-08-09 DIAGNOSIS — R7301 Impaired fasting glucose: Secondary | ICD-10-CM | POA: Diagnosis not present

## 2019-08-09 DIAGNOSIS — Z1331 Encounter for screening for depression: Secondary | ICD-10-CM | POA: Diagnosis not present

## 2019-08-09 DIAGNOSIS — B359 Dermatophytosis, unspecified: Secondary | ICD-10-CM | POA: Diagnosis not present

## 2019-08-09 DIAGNOSIS — Z23 Encounter for immunization: Secondary | ICD-10-CM | POA: Diagnosis not present

## 2019-08-11 DIAGNOSIS — D2262 Melanocytic nevi of left upper limb, including shoulder: Secondary | ICD-10-CM | POA: Diagnosis not present

## 2019-08-11 DIAGNOSIS — D2261 Melanocytic nevi of right upper limb, including shoulder: Secondary | ICD-10-CM | POA: Diagnosis not present

## 2019-08-11 DIAGNOSIS — L858 Other specified epidermal thickening: Secondary | ICD-10-CM | POA: Diagnosis not present

## 2019-08-11 DIAGNOSIS — L570 Actinic keratosis: Secondary | ICD-10-CM | POA: Diagnosis not present

## 2019-08-11 DIAGNOSIS — D225 Melanocytic nevi of trunk: Secondary | ICD-10-CM | POA: Diagnosis not present

## 2019-08-18 DIAGNOSIS — R030 Elevated blood-pressure reading, without diagnosis of hypertension: Secondary | ICD-10-CM | POA: Diagnosis not present

## 2019-08-23 DIAGNOSIS — R030 Elevated blood-pressure reading, without diagnosis of hypertension: Secondary | ICD-10-CM | POA: Diagnosis not present

## 2019-12-03 ENCOUNTER — Ambulatory Visit: Payer: BC Managed Care – PPO | Attending: Internal Medicine

## 2019-12-03 DIAGNOSIS — Z20822 Contact with and (suspected) exposure to covid-19: Secondary | ICD-10-CM | POA: Diagnosis not present

## 2019-12-05 LAB — NOVEL CORONAVIRUS, NAA: SARS-CoV-2, NAA: NOT DETECTED

## 2019-12-16 DIAGNOSIS — H2513 Age-related nuclear cataract, bilateral: Secondary | ICD-10-CM | POA: Diagnosis not present

## 2019-12-16 DIAGNOSIS — H5213 Myopia, bilateral: Secondary | ICD-10-CM | POA: Diagnosis not present

## 2020-01-25 ENCOUNTER — Ambulatory Visit: Payer: No Typology Code available for payment source | Attending: Internal Medicine

## 2020-01-25 DIAGNOSIS — Z23 Encounter for immunization: Secondary | ICD-10-CM

## 2020-01-25 NOTE — Progress Notes (Signed)
   Covid-19 Vaccination Clinic  Name:  Jose Pratt    MRN: [id number] DOB: September 07, 1956  01/25/2020  [name] was observed post Covid-19 immunization for 15 minutes without incident. He was provided with Vaccine Information Sheet and instruction to access the V-Safe system.   [name] was instructed to call 911 with any severe reactions post vaccine: Marland Kitchen Difficulty breathing  . Swelling of face and throat  . A fast heartbeat  . A bad rash all over body  . Dizziness and weakness   Immunizations Administered    Name Date Dose VIS Date Route   Moderna COVID-19 Vaccine 01/25/2020 11:00 AM 0.5 mL 10/26/2019 Intramuscular   Manufacturer: Moderna   Lot: [id number]   NDC: 43539-122-58

## 2020-02-22 ENCOUNTER — Ambulatory Visit: Payer: No Typology Code available for payment source | Attending: Internal Medicine

## 2020-02-22 DIAGNOSIS — Z23 Encounter for immunization: Secondary | ICD-10-CM

## 2020-02-22 NOTE — Progress Notes (Signed)
   Covid-19 Vaccination Clinic  Name:  BOWIE DELIA    MRN: [id number] DOB: Dec 04, 1955  02/22/2020  Mr. Walberg was observed post Covid-19 immunization for 15 minutes without incident. He was provided with Vaccine Information Sheet and instruction to access the V-Safe system.   Mr. Scheaffer was instructed to call 911 with any severe reactions post vaccine: Marland Kitchen Difficulty breathing  . Swelling of face and throat  . A fast heartbeat  . A bad rash all over body  . Dizziness and weakness   Immunizations Administered    Name Date Dose VIS Date Route   Moderna COVID-19 Vaccine 02/22/2020  9:33 AM 0.5 mL 10/26/2019 Intramuscular   Manufacturer: Moderna   Lot: [id number]   NDC: 99412-904-75

## 2020-05-04 DIAGNOSIS — Z1331 Encounter for screening for depression: Secondary | ICD-10-CM | POA: Diagnosis not present

## 2020-08-24 DIAGNOSIS — R7301 Impaired fasting glucose: Secondary | ICD-10-CM | POA: Diagnosis not present

## 2020-08-24 DIAGNOSIS — E785 Hyperlipidemia, unspecified: Secondary | ICD-10-CM | POA: Diagnosis not present

## 2020-08-24 DIAGNOSIS — Z125 Encounter for screening for malignant neoplasm of prostate: Secondary | ICD-10-CM | POA: Diagnosis not present

## 2020-08-24 DIAGNOSIS — Z0000 Encounter for general adult medical examination without abnormal findings: Secondary | ICD-10-CM | POA: Diagnosis not present

## 2020-08-26 DIAGNOSIS — Z23 Encounter for immunization: Secondary | ICD-10-CM | POA: Diagnosis not present

## 2020-08-29 DIAGNOSIS — L812 Freckles: Secondary | ICD-10-CM | POA: Diagnosis not present

## 2020-08-29 DIAGNOSIS — D1801 Hemangioma of skin and subcutaneous tissue: Secondary | ICD-10-CM | POA: Diagnosis not present

## 2020-08-29 DIAGNOSIS — D224 Melanocytic nevi of scalp and neck: Secondary | ICD-10-CM | POA: Diagnosis not present

## 2020-08-29 DIAGNOSIS — D225 Melanocytic nevi of trunk: Secondary | ICD-10-CM | POA: Diagnosis not present

## 2020-08-31 DIAGNOSIS — E785 Hyperlipidemia, unspecified: Secondary | ICD-10-CM | POA: Diagnosis not present

## 2020-08-31 DIAGNOSIS — Z0000 Encounter for general adult medical examination without abnormal findings: Secondary | ICD-10-CM | POA: Diagnosis not present

## 2020-08-31 DIAGNOSIS — R82998 Other abnormal findings in urine: Secondary | ICD-10-CM | POA: Diagnosis not present

## 2020-12-04 DIAGNOSIS — Z1152 Encounter for screening for COVID-19: Secondary | ICD-10-CM | POA: Diagnosis not present

## 2020-12-18 DIAGNOSIS — H5213 Myopia, bilateral: Secondary | ICD-10-CM | POA: Diagnosis not present

## 2020-12-18 DIAGNOSIS — H524 Presbyopia: Secondary | ICD-10-CM | POA: Diagnosis not present

## 2020-12-18 DIAGNOSIS — H2513 Age-related nuclear cataract, bilateral: Secondary | ICD-10-CM | POA: Diagnosis not present

## 2021-08-29 DIAGNOSIS — L821 Other seborrheic keratosis: Secondary | ICD-10-CM | POA: Diagnosis not present

## 2021-08-29 DIAGNOSIS — D225 Melanocytic nevi of trunk: Secondary | ICD-10-CM | POA: Diagnosis not present

## 2021-08-29 DIAGNOSIS — D2261 Melanocytic nevi of right upper limb, including shoulder: Secondary | ICD-10-CM | POA: Diagnosis not present

## 2021-08-29 DIAGNOSIS — D1801 Hemangioma of skin and subcutaneous tissue: Secondary | ICD-10-CM | POA: Diagnosis not present

## 2021-08-31 DIAGNOSIS — R110 Nausea: Secondary | ICD-10-CM | POA: Diagnosis not present

## 2021-08-31 DIAGNOSIS — I10 Essential (primary) hypertension: Secondary | ICD-10-CM | POA: Diagnosis not present

## 2021-09-07 DIAGNOSIS — E785 Hyperlipidemia, unspecified: Secondary | ICD-10-CM | POA: Diagnosis not present

## 2021-09-07 DIAGNOSIS — R7301 Impaired fasting glucose: Secondary | ICD-10-CM | POA: Diagnosis not present

## 2021-09-11 DIAGNOSIS — R82998 Other abnormal findings in urine: Secondary | ICD-10-CM | POA: Diagnosis not present

## 2021-09-11 DIAGNOSIS — R7301 Impaired fasting glucose: Secondary | ICD-10-CM | POA: Diagnosis not present

## 2021-09-11 DIAGNOSIS — I10 Essential (primary) hypertension: Secondary | ICD-10-CM | POA: Diagnosis not present

## 2021-09-11 DIAGNOSIS — Z23 Encounter for immunization: Secondary | ICD-10-CM | POA: Diagnosis not present

## 2021-09-11 DIAGNOSIS — Z0000 Encounter for general adult medical examination without abnormal findings: Secondary | ICD-10-CM | POA: Diagnosis not present

## 2021-09-11 DIAGNOSIS — Z1212 Encounter for screening for malignant neoplasm of rectum: Secondary | ICD-10-CM | POA: Diagnosis not present

## 2021-09-24 ENCOUNTER — Other Ambulatory Visit: Payer: Self-pay | Admitting: Podiatry

## 2021-09-25 ENCOUNTER — Telehealth: Payer: Self-pay | Admitting: *Deleted

## 2021-09-25 NOTE — Telephone Encounter (Signed)
Please advise, f/u appointment may be needed

## 2021-09-25 NOTE — Telephone Encounter (Signed)
Appointment for a medication refill , has not been seen since 06/10/2019. Please advise.

## 2021-10-01 ENCOUNTER — Other Ambulatory Visit: Payer: Self-pay | Admitting: Podiatry

## 2021-10-01 MED ORDER — JUBLIA 10 % EX SOLN: CUTANEOUS | 11 refills | Status: DC

## 2021-10-09 NOTE — Telephone Encounter (Signed)
Lvm for pt to call back. 

## 2021-10-09 NOTE — Telephone Encounter (Signed)
Pt called back. He stated that it is expensive to come into the office and he will call back to schedule an appt when he is able to come in.

## 2021-10-10 DIAGNOSIS — Z23 Encounter for immunization: Secondary | ICD-10-CM | POA: Diagnosis not present

## 2021-10-22 ENCOUNTER — Other Ambulatory Visit: Payer: Self-pay | Admitting: Internal Medicine

## 2021-10-22 DIAGNOSIS — E7800 Pure hypercholesterolemia, unspecified: Secondary | ICD-10-CM

## 2021-10-25 ENCOUNTER — Other Ambulatory Visit: Payer: Self-pay | Admitting: Internal Medicine

## 2021-10-25 DIAGNOSIS — I6529 Occlusion and stenosis of unspecified carotid artery: Secondary | ICD-10-CM

## 2021-11-14 ENCOUNTER — Ambulatory Visit
Admission: RE | Admit: 2021-11-14 | Discharge: 2021-11-14 | Disposition: A | Discharge status: Home / Self Care | Payer: BC Managed Care – PPO | Source: Ambulatory Visit | Attending: Internal Medicine | Admitting: Internal Medicine

## 2021-11-14 ENCOUNTER — Ambulatory Visit
Admission: RE | Admit: 2021-11-14 | Discharge: 2021-11-14 | Disposition: A | Discharge status: Home / Self Care | Payer: No Typology Code available for payment source | Source: Ambulatory Visit | Attending: Internal Medicine | Admitting: Internal Medicine

## 2021-11-14 DIAGNOSIS — E785 Hyperlipidemia, unspecified: Secondary | ICD-10-CM | POA: Diagnosis not present

## 2021-11-14 DIAGNOSIS — I6523 Occlusion and stenosis of bilateral carotid arteries: Secondary | ICD-10-CM | POA: Diagnosis not present

## 2021-11-14 DIAGNOSIS — I6529 Occlusion and stenosis of unspecified carotid artery: Secondary | ICD-10-CM

## 2021-11-14 DIAGNOSIS — H539 Unspecified visual disturbance: Secondary | ICD-10-CM | POA: Diagnosis not present

## 2021-11-14 DIAGNOSIS — I10 Essential (primary) hypertension: Secondary | ICD-10-CM | POA: Diagnosis not present

## 2021-11-14 DIAGNOSIS — E7800 Pure hypercholesterolemia, unspecified: Secondary | ICD-10-CM

## 2021-12-19 DIAGNOSIS — H2513 Age-related nuclear cataract, bilateral: Secondary | ICD-10-CM | POA: Diagnosis not present

## 2021-12-19 DIAGNOSIS — H5211 Myopia, right eye: Secondary | ICD-10-CM | POA: Diagnosis not present

## 2021-12-19 DIAGNOSIS — G43909 Migraine, unspecified, not intractable, without status migrainosus: Secondary | ICD-10-CM | POA: Diagnosis not present

## 2022-05-02 DIAGNOSIS — U071 COVID-19: Secondary | ICD-10-CM | POA: Diagnosis not present

## 2022-06-24 IMAGING — US US CAROTID DUPLEX BILAT
1 series · 13 of 24 positions shown · non-contrast
Comparison: None.

CLINICAL DATA: History hypertension, hyperlipidemia and visual
disturbance.

EXAM:
BILATERAL CAROTID DUPLEX ULTRASOUND
TECHNIQUE: Gray scale imaging, color Doppler and duplex ultrasound were
performed of bilateral carotid and vertebral arteries in the neck.

[Series 1: us carotid duplex bilat · 0.06mm/px · 13 of 68 slices shown]
[im 1/68]
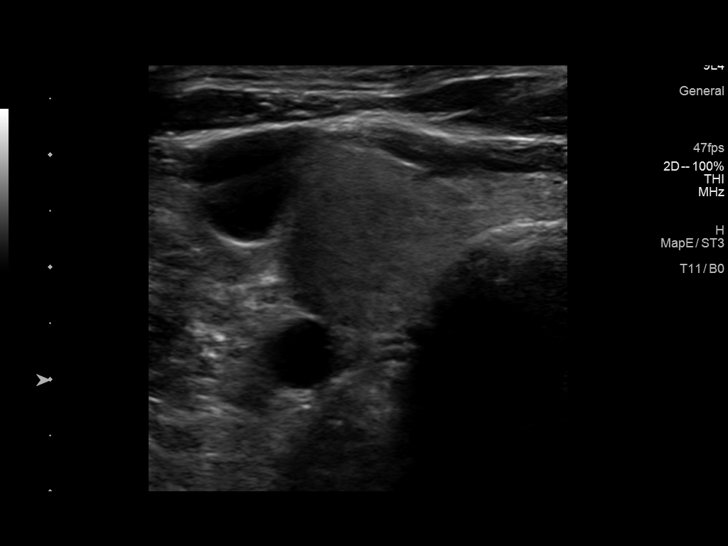
[im 6/68]
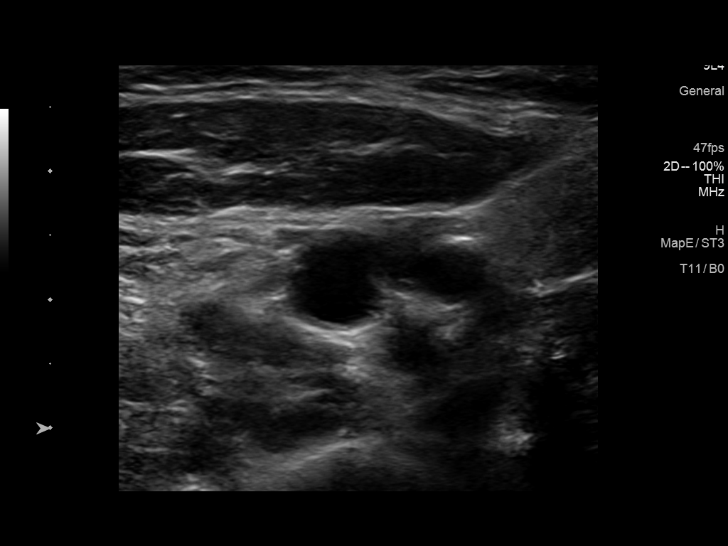
[im 12/68]
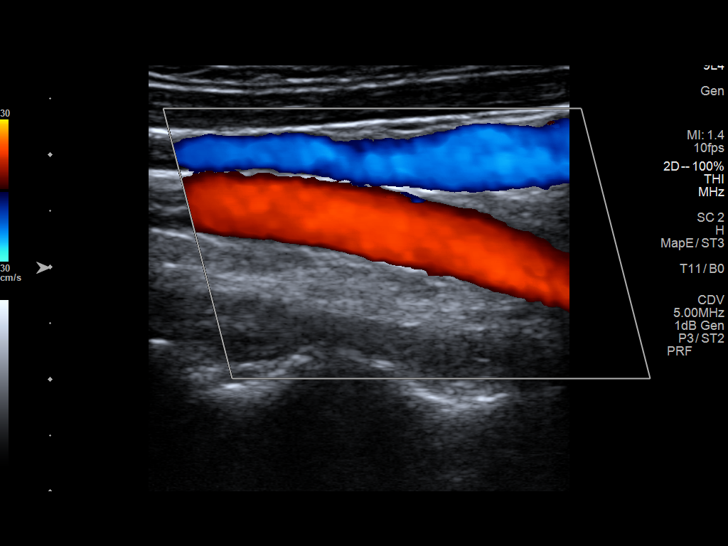
[im 18/68]
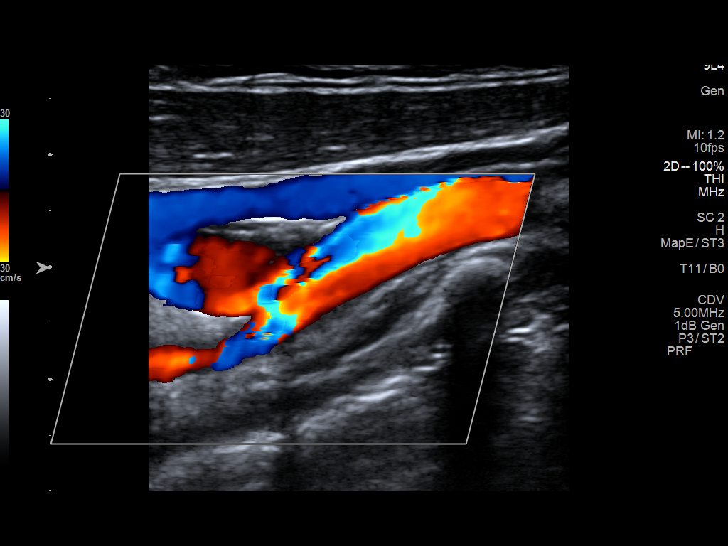
[im 24/68]
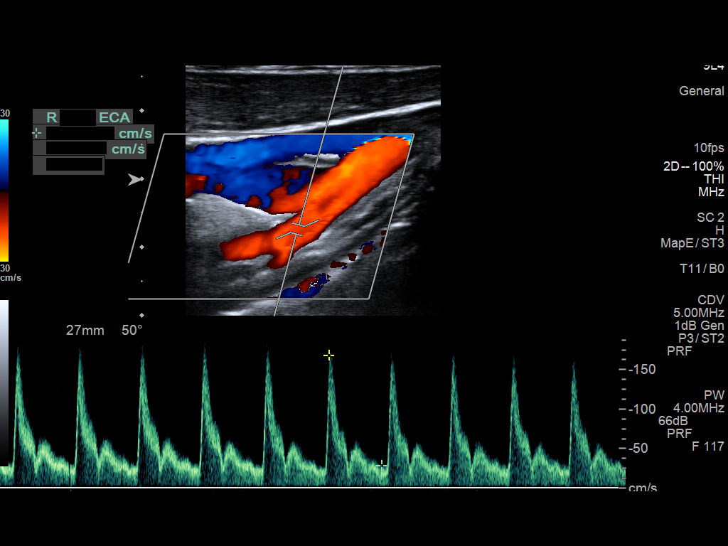
[im 30/68]
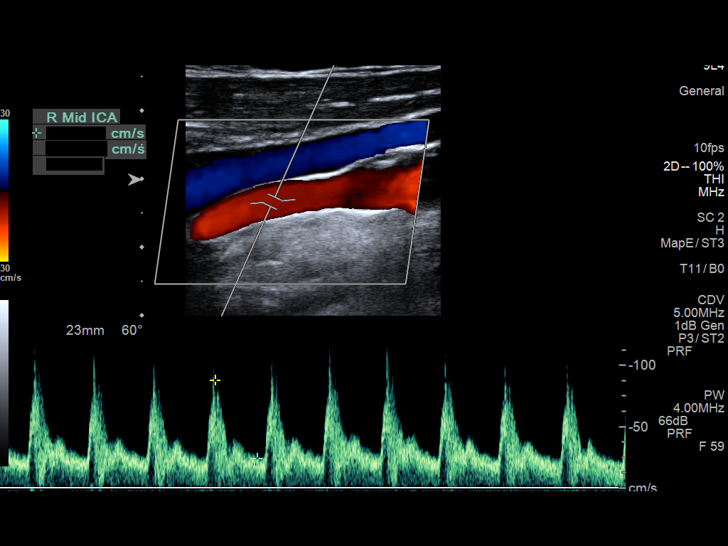
[im 35/68]
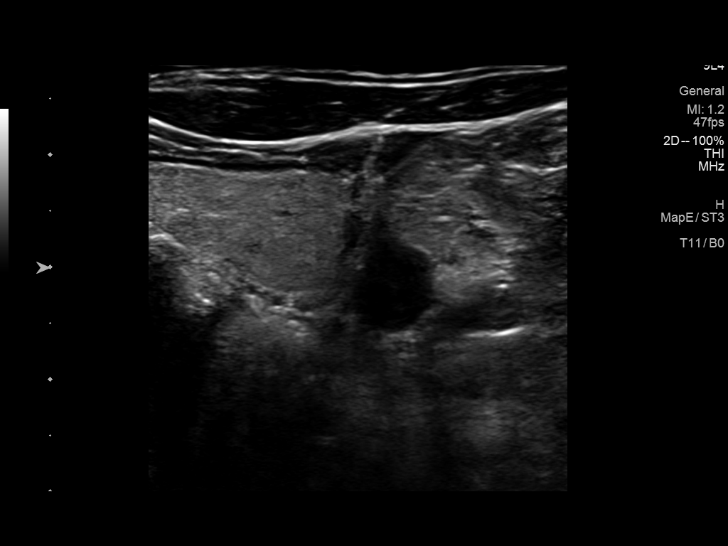
[im 38/68]
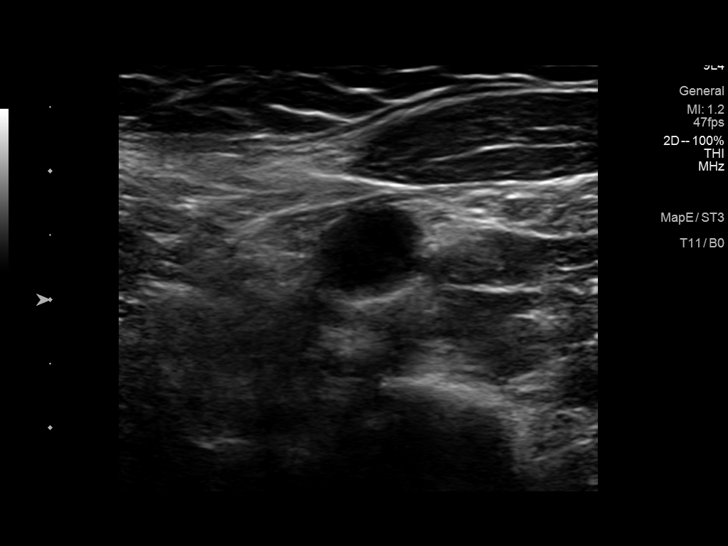
[im 44/68]
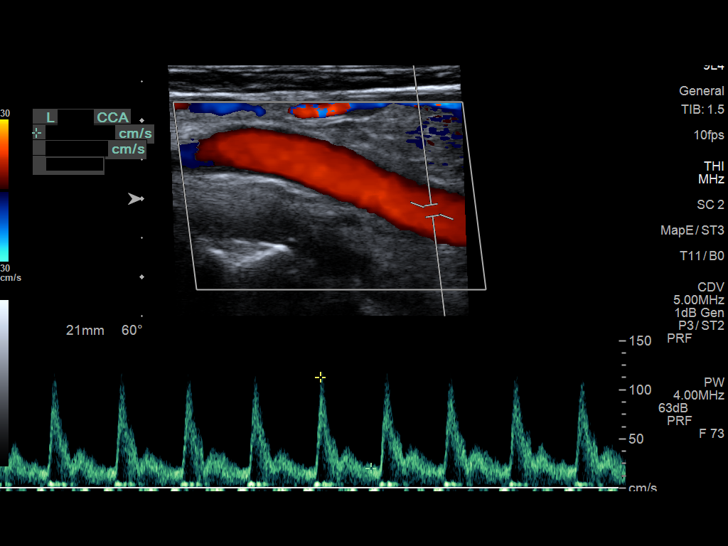
[im 50/68]
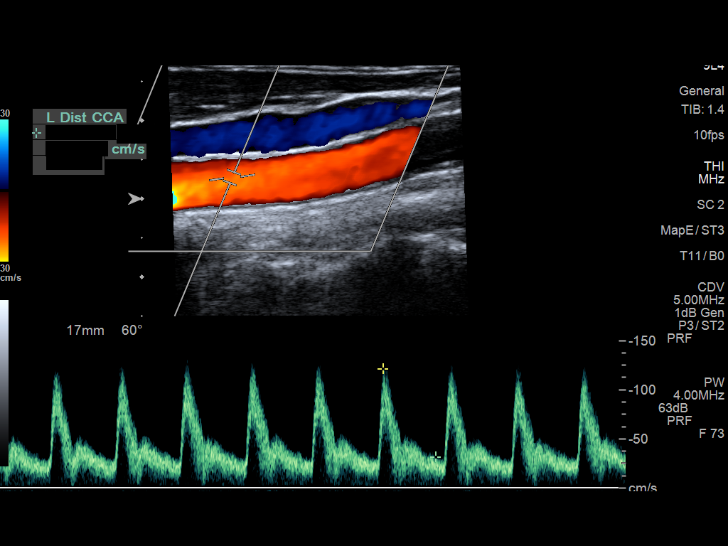
[im 56/68]
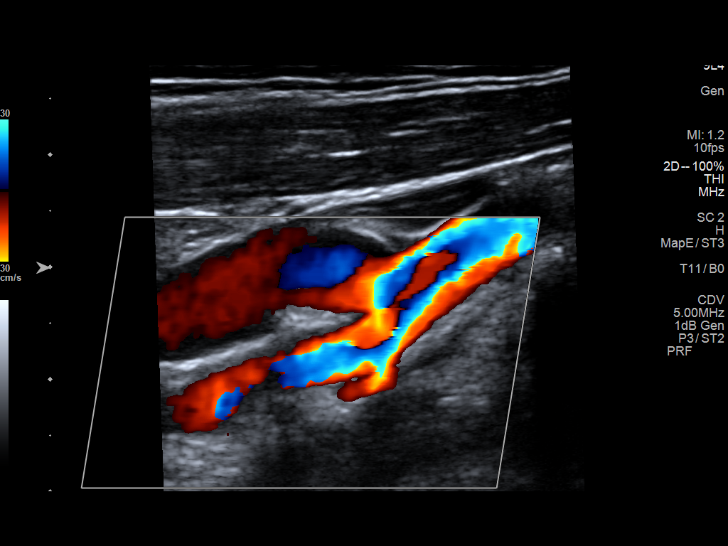
[im 62/68]
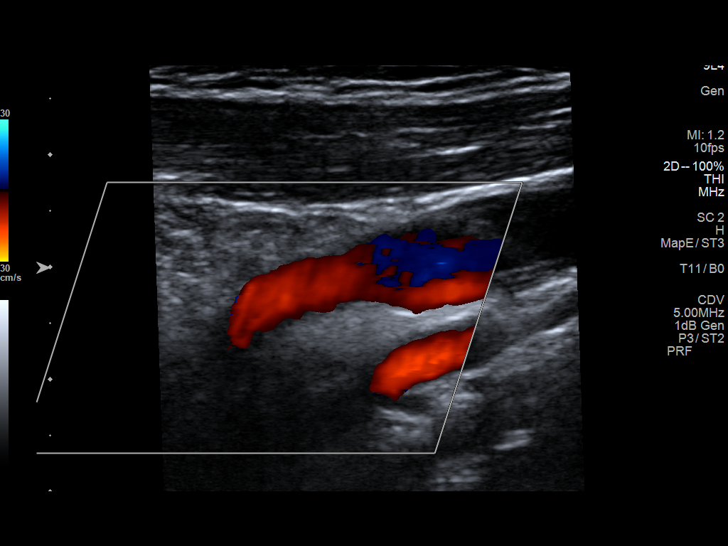
[im 68/68]
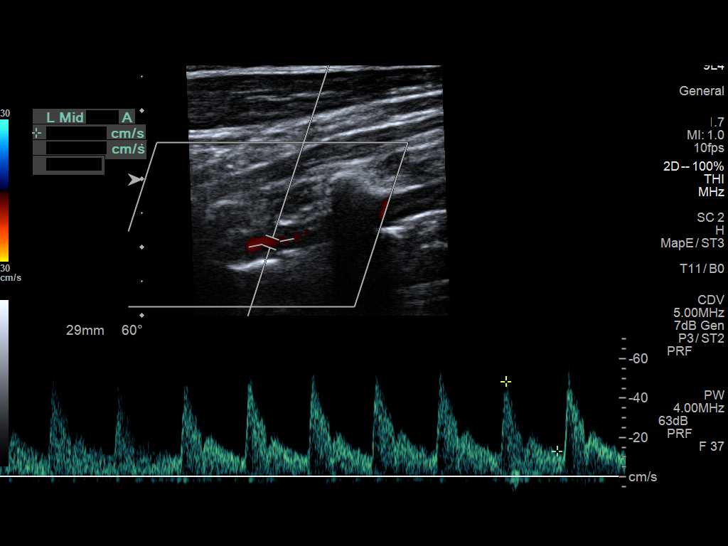

[13 of 24 positions shown; findings below may reference images not displayed]

FINDINGS: Criteria: Quantification of carotid stenosis is based on velocity
parameters that correlate the residual internal carotid diameter
with NASCET-based stenosis levels, using the diameter of the distal
internal carotid lumen as the denominator for stenosis measurement.

The following velocity measurements were obtained:

RIGHT

ICA:  120/21 cm/sec

CCA:  131/26 cm/sec

SYSTOLIC ICA/CCA RATIO:

ECA:  168 cm/sec

LEFT

ICA:  94/23 cm/sec

CCA:  133/35 cm/sec

SYSTOLIC ICA/CCA RATIO:

ECA:  126 cm/sec

RIGHT CAROTID ARTERY: Mild partially calcified plaque at the level
of the carotid bulb and proximal right ICA. Estimated right ICA
stenosis is less than 50%.

RIGHT VERTEBRAL ARTERY: Antegrade flow with normal waveform and
velocity.

LEFT CAROTID ARTERY: Mild partially calcified plaque at the level of
the carotid bulb and proximal left ICA. Estimated left ICA stenosis
is less than 50%.

LEFT VERTEBRAL ARTERY: Antegrade flow with normal waveform and
velocity.
IMPRESSION: Mild plaque at the level of bilateral carotid bulbs and proximal
internal carotid arteries. No significant carotid stenosis
identified in the neck with estimated bilateral ICA stenoses of less
than 50%.

## 2022-06-25 DIAGNOSIS — R051 Acute cough: Secondary | ICD-10-CM | POA: Diagnosis not present

## 2022-08-31 DIAGNOSIS — Z23 Encounter for immunization: Secondary | ICD-10-CM | POA: Diagnosis not present

## 2022-09-11 DIAGNOSIS — D225 Melanocytic nevi of trunk: Secondary | ICD-10-CM | POA: Diagnosis not present

## 2022-09-11 DIAGNOSIS — L821 Other seborrheic keratosis: Secondary | ICD-10-CM | POA: Diagnosis not present

## 2022-09-11 DIAGNOSIS — L218 Other seborrheic dermatitis: Secondary | ICD-10-CM | POA: Diagnosis not present

## 2022-09-11 DIAGNOSIS — D1801 Hemangioma of skin and subcutaneous tissue: Secondary | ICD-10-CM | POA: Diagnosis not present

## 2022-09-11 DIAGNOSIS — B351 Tinea unguium: Secondary | ICD-10-CM | POA: Diagnosis not present

## 2022-09-24 DIAGNOSIS — R7301 Impaired fasting glucose: Secondary | ICD-10-CM | POA: Diagnosis not present

## 2022-09-24 DIAGNOSIS — E785 Hyperlipidemia, unspecified: Secondary | ICD-10-CM | POA: Diagnosis not present

## 2022-09-24 DIAGNOSIS — I10 Essential (primary) hypertension: Secondary | ICD-10-CM | POA: Diagnosis not present

## 2022-10-01 DIAGNOSIS — I10 Essential (primary) hypertension: Secondary | ICD-10-CM | POA: Diagnosis not present

## 2022-10-01 DIAGNOSIS — Z0000 Encounter for general adult medical examination without abnormal findings: Secondary | ICD-10-CM | POA: Diagnosis not present

## 2022-10-01 DIAGNOSIS — Z1212 Encounter for screening for malignant neoplasm of rectum: Secondary | ICD-10-CM | POA: Diagnosis not present

## 2022-10-01 DIAGNOSIS — I2510 Atherosclerotic heart disease of native coronary artery without angina pectoris: Secondary | ICD-10-CM | POA: Diagnosis not present

## 2022-10-31 ENCOUNTER — Telehealth: Payer: Self-pay | Admitting: Internal Medicine

## 2022-10-31 ENCOUNTER — Encounter: Payer: Self-pay | Admitting: Internal Medicine

## 2022-12-09 ENCOUNTER — Ambulatory Visit (AMBULATORY_SURGERY_CENTER): Payer: BC Managed Care – PPO

## 2022-12-09 VITALS — Ht 67.0 in | Wt 157.0 lb

## 2022-12-09 DIAGNOSIS — Z1211 Encounter for screening for malignant neoplasm of colon: Secondary | ICD-10-CM

## 2022-12-09 MED ORDER — NA SULFATE-K SULFATE-MG SULF 17.5-3.13-1.6 GM/177ML PO SOLN: 1.0000 | Freq: Once | ORAL | 0 refills | Status: AC

## 2022-12-09 NOTE — Progress Notes (Signed)

## 2022-12-24 ENCOUNTER — Encounter: Payer: Self-pay | Admitting: Internal Medicine

## 2023-01-01 ENCOUNTER — Ambulatory Visit (AMBULATORY_SURGERY_CENTER): Payer: BC Managed Care – PPO | Admitting: Internal Medicine

## 2023-01-01 ENCOUNTER — Encounter: Payer: Self-pay | Admitting: Internal Medicine

## 2023-01-01 VITALS — BP 96/59 | HR 66 | Temp 97.5°F | Resp 10 | Ht 67.0 in | Wt 157.0 lb

## 2023-01-01 DIAGNOSIS — Z1211 Encounter for screening for malignant neoplasm of colon: Secondary | ICD-10-CM

## 2023-01-01 MED ORDER — SODIUM CHLORIDE 0.9 % IV SOLN: 500.0000 mL | Freq: Once | INTRAVENOUS | Status: DC

## 2023-01-01 NOTE — Progress Notes (Signed)
Pt's states no medical or surgical changes since previsit or office visit. 

## 2023-01-01 NOTE — Progress Notes (Signed)
GASTROENTEROLOGY PROCEDURE H&P NOTE   Primary Care Physician: Pcp, No    Reason for Procedure:  Colon cancer screening  Plan:    Colonoscopy  Patient is appropriate for endoscopic procedure(s) in the ambulatory ([facility]) setting.  The nature of the procedure, as well as the risks, benefits, and alternatives were carefully and thoroughly reviewed with the patient. Ample time for discussion and questions allowed. The patient understood, was satisfied, and agreed to proceed.     HPI: Jose Pratt is a 67 y.o. male who presents for colonoscopy.  Medical history as below.  Tolerated the prep.  No recent chest pain or shortness of breath.  No abdominal pain today.  Past Medical History:  Diagnosis Date   Allergy    Anxiety    Hypertension    Sinus infection    Vertigo     Past Surgical History:  Procedure Laterality Date   URETEROURETEROSTOMY  1987    Prior to Admission medications   Medication Sig Start Date End Date Taking? Authorizing Provider  olmesartan (BENICAR) 20 MG tablet Take 20 mg by mouth daily.   Yes [provider]  ALPRAZolam (XANAX) 0.25 MG tablet Take 0.25 mg by mouth as needed. 01/03/22   [provider]  benzonatate (TESSALON) 100 MG capsule Take 100 mg by mouth 3 (three) times daily as needed for cough. 07/08/22   [provider]  meclizine (ANTIVERT) 25 MG tablet Take 25 mg by mouth as needed. 06/18/12   [provider]  ondansetron (ZOFRAN-ODT) 4 MG disintegrating tablet Take 4 mg by mouth as needed for nausea. 08/31/21   [provider]  Psyllium (METAMUCIL 4 IN 1 FIBER) 51.7 % PACK Take 1 Scoop by mouth as needed.    [provider]  terbinafine (LAMISIL) 250 MG tablet Take 250 mg by mouth daily. 07/12/15   [provider]    Current Outpatient Medications  Medication Sig Dispense Refill   olmesartan (BENICAR) 20 MG tablet Take 20 mg by mouth daily.     ALPRAZolam (XANAX) 0.25 MG tablet Take  0.25 mg by mouth as needed.     benzonatate (TESSALON) 100 MG capsule Take 100 mg by mouth 3 (three) times daily as needed for cough.     meclizine (ANTIVERT) 25 MG tablet Take 25 mg by mouth as needed.     ondansetron (ZOFRAN-ODT) 4 MG disintegrating tablet Take 4 mg by mouth as needed for nausea.     Psyllium (METAMUCIL 4 IN 1 FIBER) 51.7 % PACK Take 1 Scoop by mouth as needed.     terbinafine (LAMISIL) 250 MG tablet Take 250 mg by mouth daily.     Current Facility-Administered Medications  Medication Dose Route Frequency Provider Last Rate Last Admin   0.9 %  sodium chloride infusion  500 mL Intravenous Once Suad Autrey, Lajuan Lines, MD        Allergies as of 01/01/2023 - Review Complete 01/01/2023  Allergen Reaction Noted   Codeine  06/14/2012   Penicillin g sodium Itching 02/21/2010   Amoxicillin Itching and Rash 07/07/1994    Family History  Problem Relation Age of Onset   Crohn's disease Brother    Colon cancer Neg Hx    Colon polyps Neg Hx    Esophageal cancer Neg Hx    Rectal cancer Neg Hx    Stomach cancer Neg Hx     Social History   Socioeconomic History   Marital status: Married    Spouse name: Not  on file   Number of children: Not on file   Years of education: Not on file   Highest education level: Not on file  Occupational History   Not on file  Tobacco Use   Smoking status: Never   Smokeless tobacco: Never  Vaping Use   Vaping Use: Never used  Substance and Sexual Activity   Alcohol use: Never   Drug use: Never   Sexual activity: Not on file  Other Topics Concern   Not on file  Social History Narrative   Not on file   Social Determinants of Health   Financial Resource Strain: Not on file  Food Insecurity: Not on file  Transportation Needs: Not on file  Physical Activity: Not on file  Stress: Not on file  Social Connections: Not on file  Intimate Partner Violence: Not on file    Physical Exam: Vital signs in last 24 hours: @BP  (!) 103/59   Pulse  84   Temp (!) 97.5 F (36.4 C)   Ht 5\' 7"  (1.702 m)   Wt 157 lb (71.2 kg)   SpO2 98%   BMI 24.59 kg/m  GEN: NAD EYE: Sclerae anicteric ENT: MMM CV: Non-tachycardic Pulm: CTA b/l GI: Soft, NT/ND NEURO:  Alert & Oriented x 3   [name], MD [facility]  01/01/2023 8:44 AM

## 2023-01-01 NOTE — Patient Instructions (Signed)
Please read handouts provided. Continue present medications. Repeat colonoscopy in 10 years for screening.   YOU HAD AN ENDOSCOPIC PROCEDURE TODAY AT THE [facility]:   Refer to the procedure report that was given to you for any specific questions about what was found during the examination.  If the procedure report does not answer your questions, please call your gastroenterologist to clarify.  If you requested that your care partner not be given the details of your procedure findings, then the procedure report has been included in a sealed envelope for you to review at your convenience later.  YOU SHOULD EXPECT: Some feelings of bloating in the abdomen. Passage of more gas than usual.  Walking can help get rid of the air that was put into your GI tract during the procedure and reduce the bloating. If you had a lower endoscopy (such as a colonoscopy or flexible sigmoidoscopy) you may notice spotting of blood in your stool or on the toilet paper. If you underwent a bowel prep for your procedure, you may not have a normal bowel movement for a few days.  Please Note:  You might notice some irritation and congestion in your nose or some drainage.  This is from the oxygen used during your procedure.  There is no need for concern and it should clear up in a day or so.  SYMPTOMS TO REPORT IMMEDIATELY:  Following lower endoscopy (colonoscopy or flexible sigmoidoscopy):  Excessive amounts of blood in the stool  Significant tenderness or worsening of abdominal pains  Swelling of the abdomen that is new, acute  Fever of 100F or higher.  For urgent or emergent issues, a gastroenterologist can be reached at any hour by calling [phone]. Do not use MyChart messaging for urgent concerns.    DIET:  We do recommend a small meal at first, but then you may proceed to your regular diet.  Drink plenty of fluids but you should avoid alcoholic beverages for 24 hours.  ACTIVITY:  You should  plan to take it easy for the rest of today and you should NOT DRIVE or use heavy machinery until tomorrow (because of the sedation medicines used during the test).    FOLLOW UP: Our staff will call the number listed on your records the next business day following your procedure.  We will call around 7:15- 8:00 am to check on you and address any questions or concerns that you may have regarding the information given to you following your procedure. If we do not reach you, we will leave a message.     If any biopsies were taken you will be contacted by phone or by letter within the next 1-3 weeks.  Please call us at [phone] if you have not heard about the biopsies in 3 weeks.    SIGNATURES/CONFIDENTIALITY: You and/or your care partner have signed paperwork which will be entered into your electronic medical record.  These signatures attest to the fact that that the information above on your After Visit Summary has been reviewed and is understood.  Full responsibility of the confidentiality of this discharge information lies with you and/or your care-partner. 

## 2023-01-01 NOTE — Progress Notes (Signed)
Report to PACU, RN, vss, BBS= Clear.  

## 2023-01-01 NOTE — Op Note (Signed)
[name] Patient Name: Jose Pratt Procedure Date: 01/01/2023 8:50 AM MRN: [id number] Endoscopist: Jerene Bears , MD, [id number] Age: 67 Referring MD:  Date of Birth: May 03, 1956 Gender: Male Account #: [id number] Procedure:                Colonoscopy Indications:              Screening for colorectal malignant neoplasm, Last                            colonoscopy 10 years ago Medicines:                Monitored Anesthesia Care Procedure:                Pre-Anesthesia Assessment:                           - Prior to the procedure, a History and Physical                            was performed, and patient medications and                            allergies were reviewed. The patient's tolerance of                            previous anesthesia was also reviewed. The risks                            and benefits of the procedure and the sedation                            options and risks were discussed with the patient.                            All questions were answered, and informed consent                            was obtained. Prior Anticoagulants: The patient has                            taken no anticoagulant or antiplatelet agents. ASA                            Grade Assessment: II - A patient with mild systemic                            disease. After reviewing the risks and benefits,                            the patient was deemed in satisfactory condition to                            undergo the procedure.  After obtaining informed consent, the colonoscope                            was passed under direct vision. Throughout the                            procedure, the patient's blood pressure, pulse, and                            oxygen saturations were monitored continuously. The                            Olympus CF-HQ190L [id number]) Colonoscope was                            introduced through the anus and advanced to  the                            cecum, identified by appendiceal orifice and                            ileocecal valve. The colonoscopy was performed                            without difficulty. The patient tolerated the                            procedure well. The quality of the bowel                            preparation was good. The ileocecal valve,                            appendiceal orifice, and rectum were photographed. Scope In: 9:01:32 AM Scope Out: 9:17:21 AM Scope Withdrawal Time: 0 hours 11 minutes 13 seconds  Total Procedure Duration: 0 hours 15 minutes 49 seconds  Findings:                 The digital rectal exam was normal.                           Multiple large-mouthed and small-mouthed                            diverticula were found in the sigmoid colon,                            descending colon and distal transverse colon.                           Internal hemorrhoids were found during                            retroflexion. The hemorrhoids were small.  The exam was otherwise without abnormality. Complications:            No immediate complications. Estimated Blood Loss:     Estimated blood loss: none. Impression:               - Moderate diverticulosis in the sigmoid colon, in                            the descending colon and in the distal transverse                            colon.                           - Internal hemorrhoids.                           - The examination was otherwise normal.                           - No specimens collected. Recommendation:           - Patient has a contact number available for                            emergencies. The signs and symptoms of potential                            delayed complications were discussed with the                            patient. Return to normal activities tomorrow.                            Written discharge instructions were provided to the                             patient.                           - Resume previous diet.                           - Continue present medications.                           - Repeat colonoscopy in 10 years for screening                            purposes. Jerene Bears, MD 01/01/2023 9:20:25 AM This report has been signed electronically.

## 2023-01-02 ENCOUNTER — Telehealth: Payer: Self-pay

## 2023-01-02 NOTE — Telephone Encounter (Signed)
  Follow up Call-     01/01/2023    7:49 AM  Call back number  Post procedure Call Back phone  # [phone]  Permission to leave phone message Yes     Patient questions:  Do you have a fever, pain , or abdominal swelling? No. Pain Score  0 *  Have you tolerated food without any problems? Yes.    Have you been able to return to your normal activities? Yes.    Do you have any questions about your discharge instructions: Diet   No. Medications  No. Follow up visit  No.  Do you have questions or concerns about your Care? No.  Actions: * If pain score is 4 or above: No action needed, pain <4.

## 2023-01-06 DIAGNOSIS — H52202 Unspecified astigmatism, left eye: Secondary | ICD-10-CM | POA: Diagnosis not present

## 2023-01-06 DIAGNOSIS — H524 Presbyopia: Secondary | ICD-10-CM | POA: Diagnosis not present

## 2023-01-06 DIAGNOSIS — H5211 Myopia, right eye: Secondary | ICD-10-CM | POA: Diagnosis not present

## 2023-01-06 DIAGNOSIS — H2513 Age-related nuclear cataract, bilateral: Secondary | ICD-10-CM | POA: Diagnosis not present

## 2023-02-14 NOTE — Telephone Encounter (Signed)
error
